# Patient Record
Sex: Female | Born: 1965 | Race: White | Hispanic: No | Marital: Single | Smoking: Never smoker
Health system: Southern US, Community
[De-identification: ages and names within clinical notes are randomized; demographics above are authoritative.]

---

## 2011-01-28 ENCOUNTER — Ambulatory Visit: Payer: Self-pay | Admitting: Family Medicine

## 2011-02-12 ENCOUNTER — Ambulatory Visit: Payer: Self-pay | Admitting: Family Medicine

## 2012-02-19 ENCOUNTER — Ambulatory Visit: Payer: Self-pay | Admitting: Family Medicine

## 2013-08-12 ENCOUNTER — Ambulatory Visit: Payer: Self-pay | Admitting: Family Medicine

## 2015-02-16 ENCOUNTER — Other Ambulatory Visit: Payer: Self-pay | Admitting: Family Medicine

## 2015-02-16 ENCOUNTER — Other Ambulatory Visit: Payer: Self-pay | Admitting: *Deleted

## 2015-02-16 DIAGNOSIS — Z1231 Encounter for screening mammogram for malignant neoplasm of breast: Secondary | ICD-10-CM

## 2015-02-19 ENCOUNTER — Other Ambulatory Visit: Payer: Self-pay | Admitting: Family Medicine

## 2015-02-19 DIAGNOSIS — Z1239 Encounter for other screening for malignant neoplasm of breast: Secondary | ICD-10-CM

## 2015-03-01 ENCOUNTER — Ambulatory Visit: Payer: Self-pay

## 2015-04-11 ENCOUNTER — Ambulatory Visit
Admission: RE | Admit: 2015-04-11 | Discharge: 2015-04-11 | Disposition: A | Discharge status: Home / Self Care | Payer: 59 | Source: Ambulatory Visit | Attending: Family Medicine | Admitting: Family Medicine

## 2015-04-11 DIAGNOSIS — Z1239 Encounter for other screening for malignant neoplasm of breast: Secondary | ICD-10-CM

## 2015-04-11 DIAGNOSIS — Z1231 Encounter for screening mammogram for malignant neoplasm of breast: Secondary | ICD-10-CM | POA: Diagnosis not present

## 2017-01-21 ENCOUNTER — Other Ambulatory Visit: Payer: Self-pay | Admitting: Family Medicine

## 2017-01-21 DIAGNOSIS — Z1231 Encounter for screening mammogram for malignant neoplasm of breast: Secondary | ICD-10-CM

## 2017-02-03 ENCOUNTER — Ambulatory Visit
Admission: RE | Admit: 2017-02-03 | Discharge: 2017-02-03 | Disposition: A | Discharge status: Home / Self Care | Payer: 59 | Source: Ambulatory Visit | Attending: Family Medicine | Admitting: Family Medicine

## 2017-02-03 DIAGNOSIS — Z1231 Encounter for screening mammogram for malignant neoplasm of breast: Secondary | ICD-10-CM | POA: Insufficient documentation

## 2018-04-19 ENCOUNTER — Encounter: Payer: Self-pay | Admitting: Intensive Care

## 2018-04-19 ENCOUNTER — Emergency Department
Admission: EM | Admit: 2018-04-19 | Discharge: 2018-04-19 | Disposition: A | Discharge status: Home / Self Care | Payer: Managed Care, Other (non HMO) | Attending: Emergency Medicine | Admitting: Emergency Medicine

## 2018-04-19 ENCOUNTER — Emergency Department: Payer: Managed Care, Other (non HMO)

## 2018-04-19 ENCOUNTER — Other Ambulatory Visit: Payer: Self-pay

## 2018-04-19 DIAGNOSIS — R519 Headache, unspecified: Secondary | ICD-10-CM

## 2018-04-19 DIAGNOSIS — R030 Elevated blood-pressure reading, without diagnosis of hypertension: Secondary | ICD-10-CM | POA: Insufficient documentation

## 2018-04-19 DIAGNOSIS — R51 Headache: Secondary | ICD-10-CM | POA: Diagnosis present

## 2018-04-19 LAB — CBC WITH DIFFERENTIAL/PLATELET
ABS IMMATURE GRANULOCYTES: 0.05 10*3/uL (ref 0.00–0.07)
Basophils Absolute: 0 10*3/uL (ref 0.0–0.1)
Basophils Relative: 0 %
Eosinophils Absolute: 0 10*3/uL (ref 0.0–0.5)
Eosinophils Relative: 0 %
HCT: 41 % (ref 36.0–46.0)
HEMOGLOBIN: 13.5 g/dL (ref 12.0–15.0)
Immature Granulocytes: 0 %
LYMPHS PCT: 7 %
Lymphs Abs: 0.8 10*3/uL (ref 0.7–4.0)
MCH: 30.3 pg (ref 26.0–34.0)
MCHC: 32.9 g/dL (ref 30.0–36.0)
MCV: 91.9 fL (ref 80.0–100.0)
MONO ABS: 0.4 10*3/uL (ref 0.1–1.0)
MONOS PCT: 4 %
NEUTROS ABS: 10.3 10*3/uL — AB (ref 1.7–7.7)
Neutrophils Relative %: 89 %
Platelets: 252 10*3/uL (ref 150–400)
RBC: 4.46 MIL/uL (ref 3.87–5.11)
RDW: 11.9 % (ref 11.5–15.5)
WBC: 11.6 10*3/uL — ABNORMAL HIGH (ref 4.0–10.5)
nRBC: 0 % (ref 0.0–0.2)

## 2018-04-19 LAB — BASIC METABOLIC PANEL
Anion gap: 9 (ref 5–15)
BUN: 18 mg/dL (ref 6–20)
CHLORIDE: 101 mmol/L (ref 98–111)
CO2: 25 mmol/L (ref 22–32)
Calcium: 9.1 mg/dL (ref 8.9–10.3)
Creatinine, Ser: 0.61 mg/dL (ref 0.44–1.00)
GFR calc Af Amer: >60 mL/min (ref >60)
GFR calc non Af Amer: >60 mL/min (ref >60)
GLUCOSE: 148 mg/dL — AB (ref 70–99)
Potassium: 3.8 mmol/L (ref 3.5–5.1)
Sodium: 135 mmol/L (ref 135–145)

## 2018-04-19 MED ORDER — DEXAMETHASONE SODIUM PHOSPHATE 10 MG/ML IJ SOLN
10.0000 mg | Freq: Once | INTRAMUSCULAR | Status: AC
  Administered 2018-04-19: 10 mg via INTRAVENOUS
  Filled 2018-04-19: qty 1

## 2018-04-19 MED ORDER — KETOROLAC TROMETHAMINE 30 MG/ML IJ SOLN
30.0000 mg | Freq: Once | INTRAMUSCULAR | Status: AC
  Administered 2018-04-19: 30 mg via INTRAVENOUS
  Filled 2018-04-19: qty 1

## 2018-04-19 MED ORDER — ONDANSETRON HCL 4 MG/2ML IJ SOLN
4.0000 mg | Freq: Once | INTRAMUSCULAR | Status: AC
  Administered 2018-04-19: 4 mg via INTRAVENOUS
  Filled 2018-04-19: qty 2

## 2018-04-19 MED ORDER — AMLODIPINE BESYLATE 5 MG PO TABS: 5.0000 mg | ORAL_TABLET | Freq: Every day | ORAL | 1 refills | Status: AC

## 2018-04-19 MED ORDER — SODIUM CHLORIDE 0.9 % IV BOLUS
1000.0000 mL | Freq: Once | INTRAVENOUS | Status: AC
  Administered 2018-04-19: 1000 mL via INTRAVENOUS

## 2018-04-19 MED ORDER — MORPHINE SULFATE (PF) 4 MG/ML IV SOLN
4.0000 mg | Freq: Once | INTRAVENOUS | Status: AC
  Administered 2018-04-19: 4 mg via INTRAVENOUS
  Filled 2018-04-19: qty 1

## 2018-04-19 MED ORDER — KETOROLAC TROMETHAMINE 10 MG PO TABS: 10.0000 mg | ORAL_TABLET | Freq: Four times a day (QID) | ORAL | 0 refills | Status: AC | PRN

## 2018-04-19 MED ORDER — TRAMADOL HCL 50 MG PO TABS: 50.0000 mg | ORAL_TABLET | Freq: Four times a day (QID) | ORAL | 0 refills | Status: AC | PRN

## 2018-04-19 NOTE — Discharge Instructions (Signed)
Follow-up with your regular doctor if not improving in 1 to 2 days.  Return emergency department worsening. 

## 2018-04-19 NOTE — ED Provider Notes (Signed)
[facility] Emergency Department Provider Note  ____________________________________________   First MD Initiated Contact with Patient 04/19/18 1008     (approximate)  I have reviewed the triage vital signs and the nursing notes.   HISTORY  Chief Complaint Headache    HPI Brooke Moses is a 52 y.o. female presents emergency department with a headache.  She states she had a headache since Friday.  She states that was the worst headache of her life.  It was a 10 out of 10.  This happened while at work and they later in a quiet room noticed her blood pressure was very elevated.  She went home and had some rest and had another headache.  She has had another headache today.  She states she is concerned as her blood pressures been very elevated.  Her friend had a Norvasc at home and gave her this medication.  She took this around 6:30 AM.  She states the headache started this morning during a dream.  It awoke her out of her sleep.  She states she has nausea but no vomiting.  No aura.  She has been on cold medication and Augmentin.  She states she stopped taking the Alka-Seltzer cold medication couple of days ago.   She denies slurred speech, loss of movement of extremities, facial droop, or any other stroke symptoms.   History reviewed. No pertinent past medical history.  There are no active problems to display for this patient.   History reviewed. No pertinent surgical history.  Prior to Admission medications   Medication Sig Start Date End Date Taking? Authorizing Provider  amLODipine (NORVASC) 5 MG tablet Take 1 tablet (5 mg total) by mouth daily. 04/19/18   Fisher, Linden Dolin, PA-C  ketorolac (TORADOL) 10 MG tablet Take 1 tablet (10 mg total) by mouth every 6 (six) hours as needed. 04/19/18   Versie Starks, PA-C  traMADol (ULTRAM) 50 MG tablet Take 1 tablet (50 mg total) by mouth every 6 (six) hours as needed. 04/19/18   Versie Starks, PA-C    Allergies Kiwi  extract; Mango flavor; and Pistachio nut (diagnostic)  Family History  Problem Relation Age of Onset  . Breast cancer Maternal Aunt 34    Social History Social History   Tobacco Use  . Smoking status: Never Smoker  . Smokeless tobacco: Never Used  Substance Use Topics  . Alcohol use: Yes    Alcohol/week: 7.0 standard drinks    Types: 7 Glasses of wine per week  . Drug use: Never    Review of Systems  Constitutional: No fever/chills, positive headache Eyes: No visual changes. ENT: No sore throat. Respiratory: Denies cough Genitourinary: Negative for dysuria. Musculoskeletal: Negative for back pain. Skin: Negative for rash.    ____________________________________________   PHYSICAL EXAM:  VITAL SIGNS: ED Triage Vitals  Enc Vitals Group     BP 04/19/18 0937 (!) 164/75     Pulse Rate 04/19/18 0937 64     Resp 04/19/18 0937 18     Temp 04/19/18 0937 98.4 F (36.9 C)     Temp Source 04/19/18 0937 Oral     SpO2 04/19/18 0937 95 %     Weight 04/19/18 0938 200 lb (90.7 kg)     Height 04/19/18 0938 '5\' 4"'  (1.626 m)     Head Circumference --      Peak Flow --      Pain Score 04/19/18 0946 5     Pain Loc --  Pain Edu? --      Excl. in [place]? --     Constitutional: Alert and oriented. Well appearing and in no acute distress. Eyes: Conjunctivae are normal.  Head: Atraumatic. Nose: No congestion/rhinnorhea. Mouth/Throat: Mucous membranes are moist.   Neck:  supple no lymphadenopathy noted Cardiovascular: Normal rate, regular rhythm. Heart sounds are normal Respiratory: Normal respiratory effort.  No retractions, lungs c t a  Abd: soft nontender bs normal all 4 quad GU: deferred Musculoskeletal: FROM all extremities, warm and well perfused Neurologic:  Normal speech and language.  Skin:  Skin is warm, dry and intact. No rash noted. Psychiatric: Mood and affect are normal. Speech and behavior are normal.  ____________________________________________    LABS (all labs ordered are listed, but only abnormal results are displayed)  Labs Reviewed  BASIC METABOLIC PANEL - Abnormal; Notable for the following components:      Result Value   Glucose, Bld 148 (*)    All other components within normal limits  CBC WITH DIFFERENTIAL/PLATELET - Abnormal; Notable for the following components:   WBC 11.6 (*)    Neutro Abs 10.3 (*)    All other components within normal limits   ____________________________________________   ____________________________________________  RADIOLOGY  CT the head is negative  ____________________________________________   PROCEDURES  Procedure(s) performed: Saline lock, normal saline 1 L IV, morphine 4 mg IV, Zofran 4 mg IV  Procedures    ____________________________________________   INITIAL IMPRESSION / ASSESSMENT AND PLAN / ED COURSE  Pertinent labs & imaging results that were available during my care of the patient were reviewed by me and considered in my medical decision making (see chart for details).   Patient presents to the emergency department with severe headache and concerns of elevated blood pressure.  Physical exam shows a nontoxic female.  She is sensitive to light.  Remainder the exam is basically unremarkable.  CT of the head is negative  Patient was given saline lock, normal saline 1 L IV, Zofran 4 mg IV, morphine 4 mg IV, Decadron 10 mg IV, and Toradol 30 mg IV.  Initially the patient did not have relief with just the saline Zofran and morphine.  After the negative CT Decadron and Toradol were added.  She states she has relief with these medications.  She was given instructions to follow-up with her regular doctor for further evaluation.  She was given a prescription for Norvasc 5 mg p.o. daily, Toradol 10 mg #20 no refill, and tramadol 50 mg #15 for pain not controlled by the Toradol.  She states she understands and will comply.  She is to increase her fluid intake.  She was  discharged in stable condition in the care of her housemate.     As part of my medical decision making, I reviewed the following data within the [facility] notes reviewed and incorporated, Labs reviewed CBC met B are normal, Old chart reviewed, Radiograph reviewed CT the head is negative, Notes from prior ED visits and [place] Controlled Substance Database  ____________________________________________   FINAL CLINICAL IMPRESSION(S) / ED DIAGNOSES  Final diagnoses:  Acute nonintractable headache, unspecified headache type  Elevated blood pressure reading      NEW MEDICATIONS STARTED DURING THIS VISIT:  Discharge Medication List as of 04/19/2018 12:34 PM    START taking these medications   Details  amLODipine (NORVASC) 5 MG tablet Take 1 tablet (5 mg total) by mouth daily., Starting Mon 04/19/2018, Normal    ketorolac (TORADOL)  10 MG tablet Take 1 tablet (10 mg total) by mouth every 6 (six) hours as needed., Starting Mon 04/19/2018, Normal    traMADol (ULTRAM) 50 MG tablet Take 1 tablet (50 mg total) by mouth every 6 (six) hours as needed., Starting Mon 04/19/2018, Normal         Note:  This document was prepared using Dragon voice recognition software and may include unintentional dictation errors.    Versie Starks, PA-C 04/19/18 1306    Nena Polio, MD 04/19/18 [phone]

## 2018-04-19 NOTE — ED Notes (Signed)
See triage note  Presents with headache since Friday  States headache started after cough  Min relief over the weekend  But states headache woke her up this am  Positive nausea

## 2018-04-19 NOTE — ED Triage Notes (Signed)
Patient states "Friday at work I started coughing and having a severe migraine with nausea" Patient has another episode Saturday not as intense. Reports blood pressure was high during episode. C/o headache right now with light and noise sensitivity. A&O x4. Able to speak in complete sentences

## 2020-07-18 IMAGING — CT CT HEAD W/O CM
3 series · 16 of 46 positions shown, 19 images · non-contrast
Comparison: None.

CLINICAL DATA: Headaches, acute severe headache worse of life.

EXAM:
CT HEAD WITHOUT CONTRAST
TECHNIQUE: Contiguous axial images were obtained from the base of the skull
through the vertex without intravenous contrast.

[Series 2: head wo · axial · 0.40mm/px · z∈[-91,+29]mm · 10 of 29 slices shown, 13 images]
[im 3/29  brain]
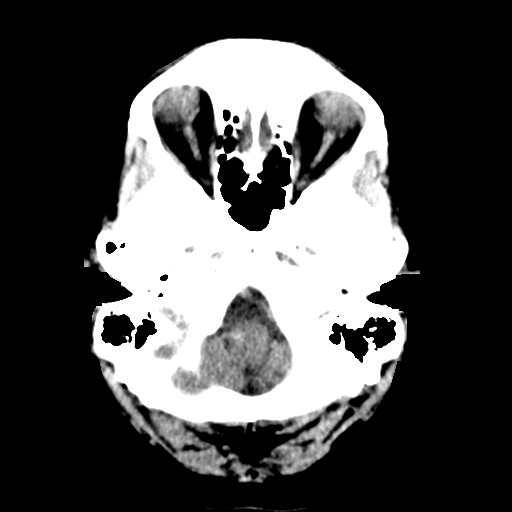
[im 3/29  bone]
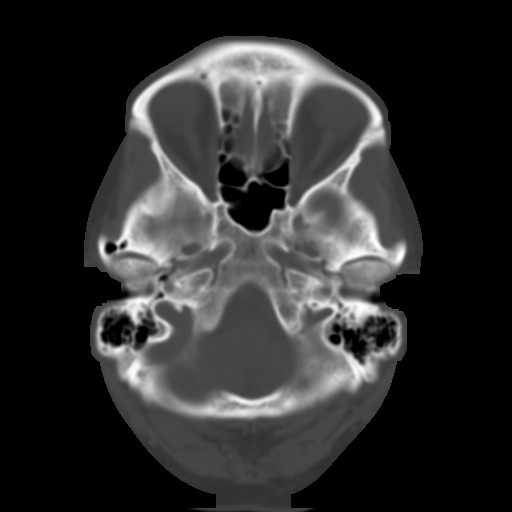
[im 6/29  brain]
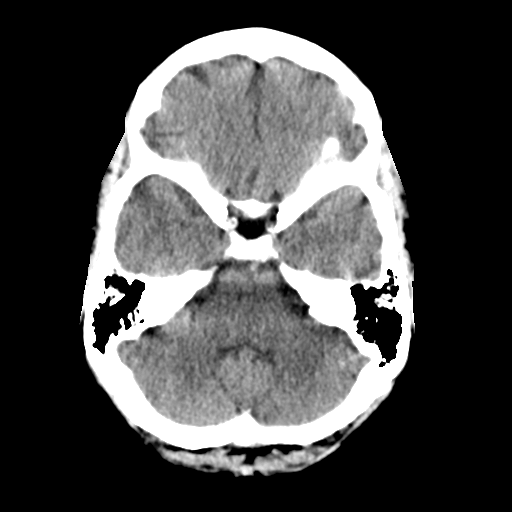
[im 8/29  brain]
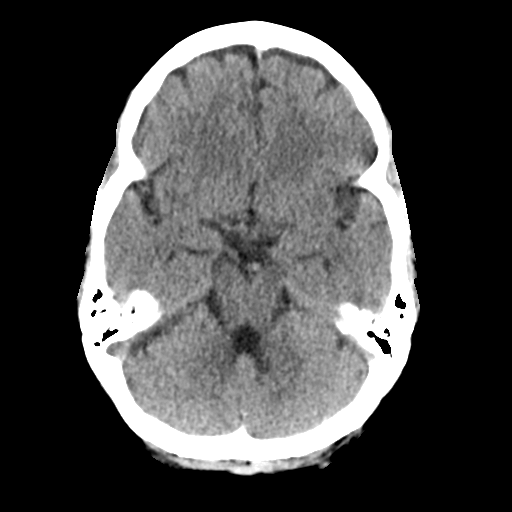
[im 11/29  brain]
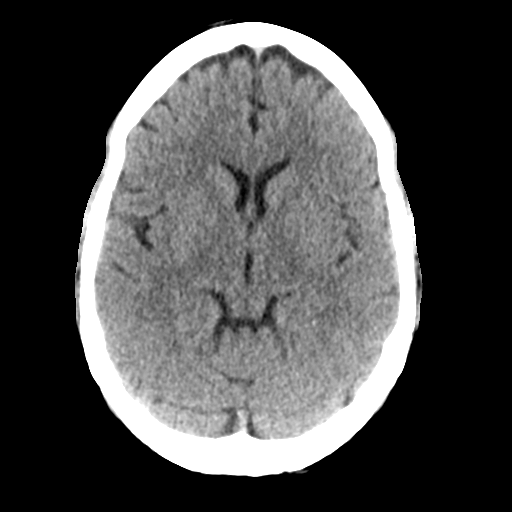
[im 14/29  brain]
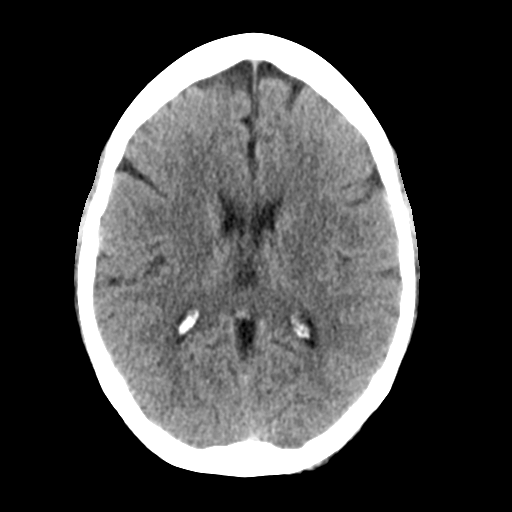
[im 14/29  bone]
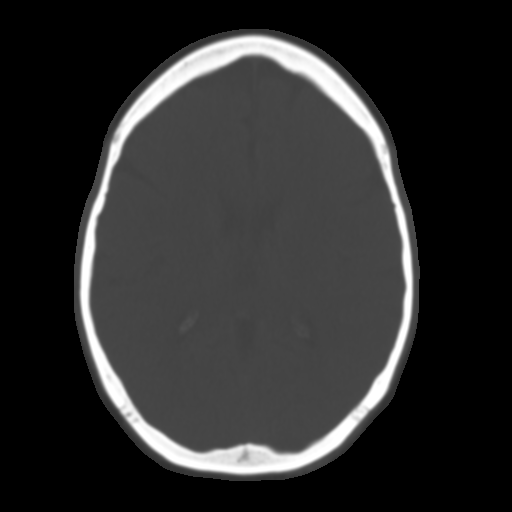
[im 16/29  brain]
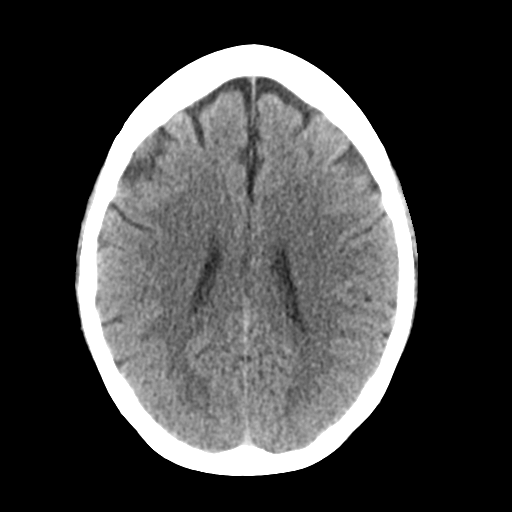
[im 19/29  brain]
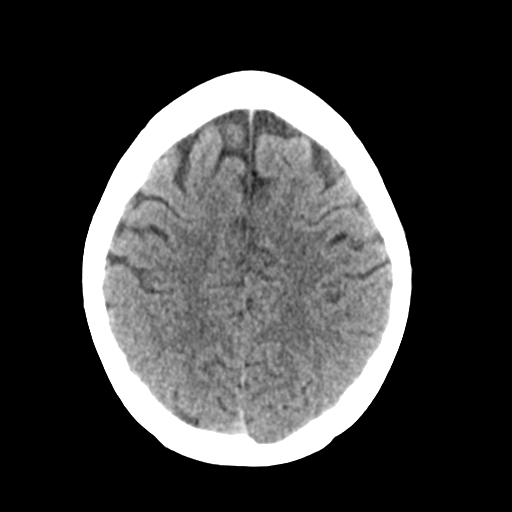
[im 22/29  brain]
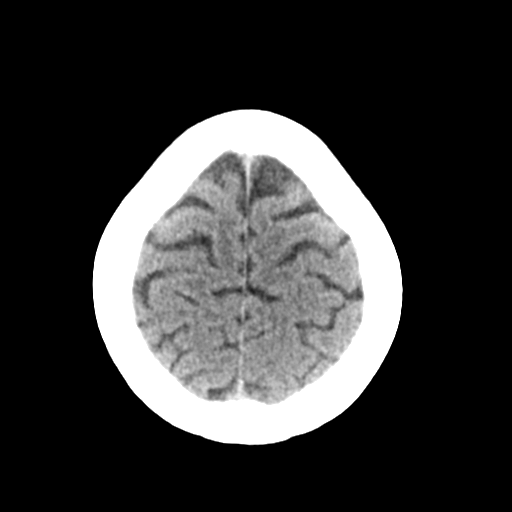
[im 24/29  brain]
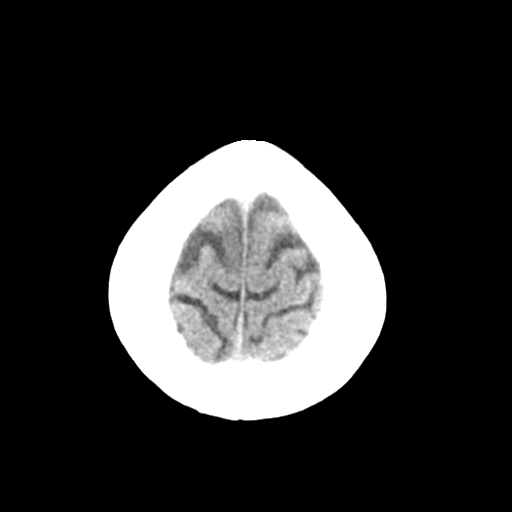
[im 24/29  bone]
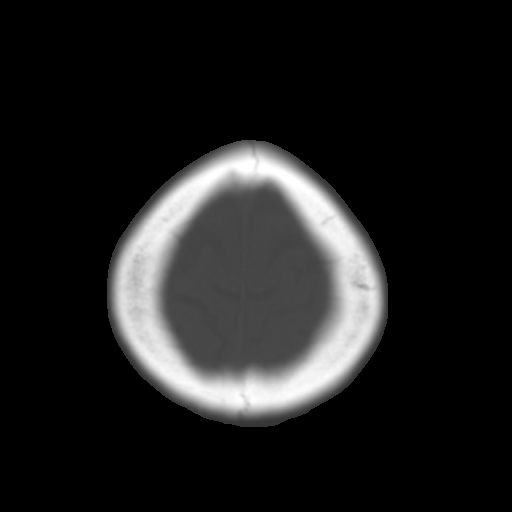
[im 27/29  brain]
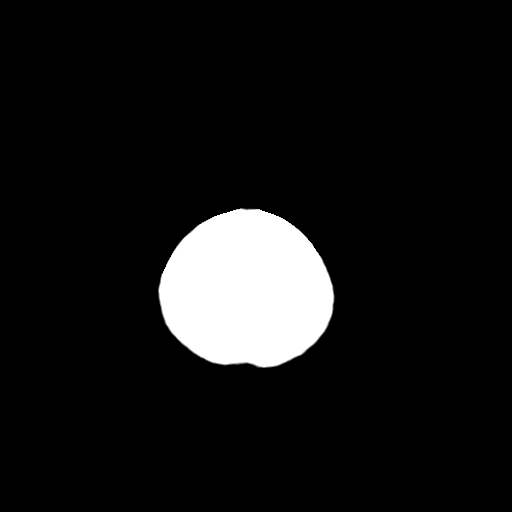

[Series 4: coronal soft tissue · coronal · 0.30mm/px · 3 of 66 slices shown]
[im 22/66  brain]
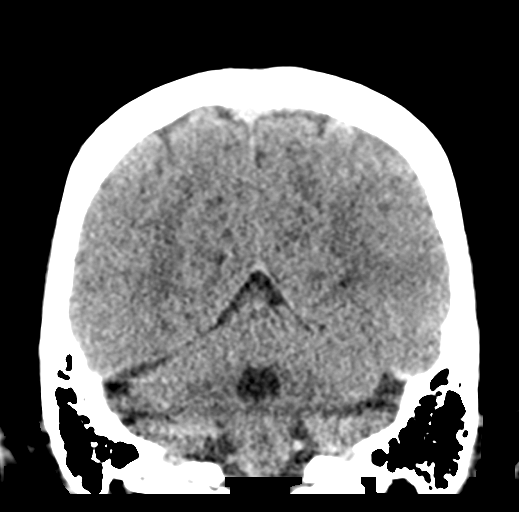
[im 29/66  brain]
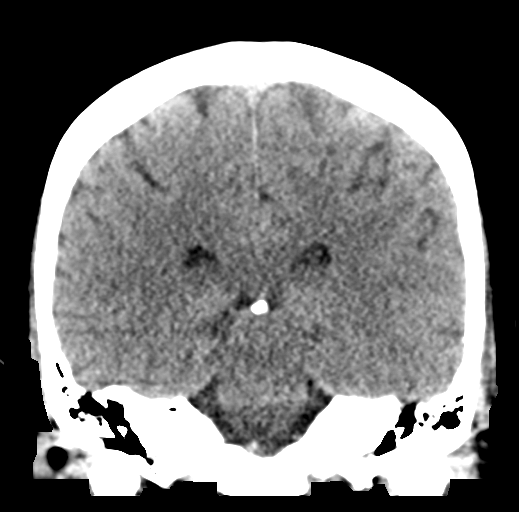
[im 37/66  brain]
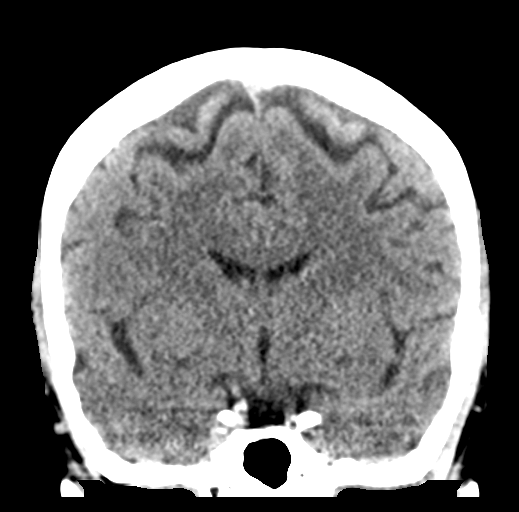

[Series 5: sagittal soft tissue · sagittal · 0.33mm/px · 3 of 51 slices shown]
[im 17/51  brain]
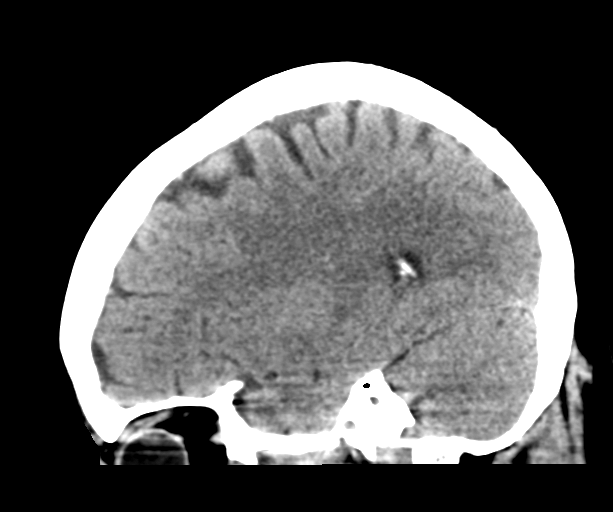
[im 26/51  brain]
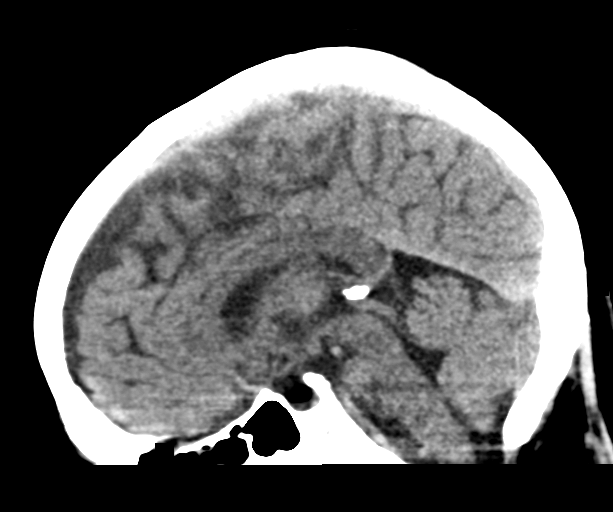
[im 34/51  brain]
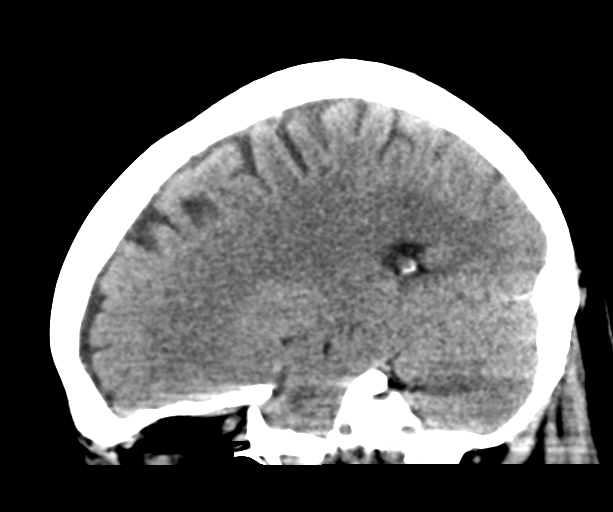

[16 of 46 positions shown; findings below may reference images not displayed]

FINDINGS: Brain: No evidence of acute infarction, hemorrhage, hydrocephalus,
extra-axial collection or mass lesion/mass effect.

Vascular: No hyperdense vessel or unexpected calcification.

Skull: No osseous abnormality.

Sinuses/Orbits: Visualized paranasal sinuses are clear. Visualized
mastoid sinuses are clear. Visualized orbits demonstrate no focal
abnormality.

Other: None
IMPRESSION: No acute intracranial pathology.
